# Patient Record
Sex: Male | Born: 1980 | Race: Black or African American | Hispanic: No | Marital: Married
Health system: Southern US, Community
[De-identification: ages and names within clinical notes are randomized; demographics above are authoritative.]

---

## 2008-09-21 ENCOUNTER — Emergency Department (HOSPITAL_COMMUNITY): Admission: EM | Admit: 2008-09-21 | Discharge: 2008-09-21 | Payer: Self-pay | Admitting: Emergency Medicine

## 2010-07-31 ENCOUNTER — Encounter
Admission: RE | Admit: 2010-07-31 | Discharge: 2010-07-31 | Payer: Self-pay | Source: Home / Self Care | Attending: Occupational Medicine | Admitting: Occupational Medicine

## 2020-07-20 ENCOUNTER — Encounter (HOSPITAL_COMMUNITY): Payer: Self-pay

## 2020-07-20 ENCOUNTER — Other Ambulatory Visit: Payer: Self-pay

## 2020-07-20 ENCOUNTER — Ambulatory Visit (HOSPITAL_COMMUNITY)
Admission: EM | Admit: 2020-07-20 | Discharge: 2020-07-20 | Disposition: A | Discharge status: Home / Self Care | Payer: 59

## 2020-07-20 DIAGNOSIS — M25532 Pain in left wrist: Secondary | ICD-10-CM

## 2020-07-20 DIAGNOSIS — M25531 Pain in right wrist: Secondary | ICD-10-CM | POA: Diagnosis not present
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
## 2020-07-20 NOTE — Discharge Instructions (Signed)
Take Tylenol or ibuprofen as needed for discomfort.    Follow up with your primary care provider if your symptoms are not improving.    
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
